# Patient Record
Sex: Female | Born: 1985 | Race: White | Hispanic: No | Marital: Married | Smoking: Former smoker
Health system: Southern US, Community
[De-identification: ages and names within clinical notes are randomized; demographics above are authoritative.]

## PROBLEM LIST (undated history)

## (undated) DIAGNOSIS — S82899A Other fracture of unspecified lower leg, initial encounter for closed fracture: Secondary | ICD-10-CM

## (undated) DIAGNOSIS — Z01419 Encounter for gynecological examination (general) (routine) without abnormal findings: Secondary | ICD-10-CM

## (undated) HISTORY — PX: OTHER SURGICAL HISTORY: SHX169

## (undated) HISTORY — DX: Other fracture of unspecified lower leg, initial encounter for closed fracture: S82.899A

## (undated) HISTORY — DX: Encounter for gynecological examination (general) (routine) without abnormal findings: Z01.419

---

## 2006-10-07 ENCOUNTER — Other Ambulatory Visit: Admission: RE | Admit: 2006-10-07 | Discharge: 2006-10-07 | Payer: Self-pay | Admitting: Obstetrics and Gynecology

## 2006-12-15 ENCOUNTER — Ambulatory Visit: Payer: Self-pay | Admitting: Family Medicine

## 2006-12-29 ENCOUNTER — Other Ambulatory Visit: Admission: RE | Admit: 2006-12-29 | Discharge: 2006-12-29 | Payer: Self-pay | Admitting: Obstetrics and Gynecology

## 2007-01-03 ENCOUNTER — Ambulatory Visit: Payer: Self-pay | Admitting: Internal Medicine

## 2007-01-31 ENCOUNTER — Ambulatory Visit: Payer: Self-pay | Admitting: Internal Medicine

## 2008-10-02 ENCOUNTER — Ambulatory Visit: Payer: Self-pay | Admitting: Internal Medicine

## 2008-10-02 ENCOUNTER — Telehealth: Payer: Self-pay | Admitting: Internal Medicine

## 2008-10-02 DIAGNOSIS — J45901 Unspecified asthma with (acute) exacerbation: Secondary | ICD-10-CM | POA: Insufficient documentation

## 2008-10-02 DIAGNOSIS — J309 Allergic rhinitis, unspecified: Secondary | ICD-10-CM | POA: Insufficient documentation

## 2008-10-15 ENCOUNTER — Telehealth: Payer: Self-pay | Admitting: Internal Medicine

## 2008-12-10 ENCOUNTER — Ambulatory Visit: Payer: Self-pay | Admitting: Internal Medicine

## 2008-12-10 DIAGNOSIS — J45909 Unspecified asthma, uncomplicated: Secondary | ICD-10-CM | POA: Insufficient documentation

## 2009-01-15 ENCOUNTER — Telehealth: Payer: Self-pay | Admitting: Internal Medicine

## 2009-02-10 ENCOUNTER — Ambulatory Visit: Payer: Self-pay | Admitting: Internal Medicine

## 2009-02-11 ENCOUNTER — Other Ambulatory Visit: Admission: RE | Admit: 2009-02-11 | Discharge: 2009-02-11 | Payer: Self-pay | Admitting: Obstetrics and Gynecology

## 2009-06-13 ENCOUNTER — Ambulatory Visit: Payer: Self-pay | Admitting: Internal Medicine

## 2009-07-31 ENCOUNTER — Telehealth: Payer: Self-pay | Admitting: Internal Medicine

## 2009-12-13 HISTORY — PX: OTHER SURGICAL HISTORY: SHX169

## 2010-01-07 ENCOUNTER — Ambulatory Visit (HOSPITAL_COMMUNITY): Admission: RE | Admit: 2010-01-07 | Discharge: 2010-01-08 | Payer: Self-pay | Admitting: Specialist

## 2010-04-06 ENCOUNTER — Ambulatory Visit (HOSPITAL_BASED_OUTPATIENT_CLINIC_OR_DEPARTMENT_OTHER): Admission: RE | Admit: 2010-04-06 | Discharge: 2010-04-06 | Payer: Self-pay | Admitting: Specialist

## 2010-08-03 ENCOUNTER — Ambulatory Visit: Payer: Self-pay | Admitting: Internal Medicine

## 2010-08-03 DIAGNOSIS — E669 Obesity, unspecified: Secondary | ICD-10-CM

## 2010-08-10 ENCOUNTER — Telehealth: Payer: Self-pay | Admitting: *Deleted

## 2010-11-19 ENCOUNTER — Emergency Department (HOSPITAL_COMMUNITY): Admission: EM | Admit: 2010-11-19 | Discharge: 2009-12-27 | Payer: Self-pay | Admitting: Emergency Medicine

## 2011-01-01 ENCOUNTER — Encounter: Payer: Self-pay | Admitting: Internal Medicine

## 2011-01-12 NOTE — Assessment & Plan Note (Signed)
Summary: med check and refill/cjr   Vital Signs:  Patient profile:   25 year old female Menstrual status:  irregular LMP:     07/30/2010 Height:      63.75 inches Weight:      233 pounds BMI:     40.45 O2 Sat:      98 % on Room air Pulse rate:   66 / minute BP sitting:   110 / 70  (right arm) Cuff size:   large  Vitals Entered By: Romualdo Bolk, CMA Duncan Dull) (August 03, 2010 3:34 PM)  O2 Flow:  Room air CC: Follow-up visit on meds LMP (date): 07/30/2010 LMP - Character: normal Menarche (age onset years): 11   Menses interval (days): 28 Menstrual flow (days): 7-14 Enter LMP: 07/30/2010   History of Present Illness: Sherry Quinn  comes in today  for follow up of resp status and meds.   Since last visit she had a broken leg. On Jan14 tripped and broke  leg fibula  and  medial ankle.     had surgery Per Dr Dr Shelle Iron.  was on PT but too expensive  and stopped .   pain is tolerable.   so far.   walks ok.    No falls.  RESP: NO major flares  ., noted better since moving and also when went to [place] and breathing was very good.  Triggers :muggy days  and weather related.   seasonal  Not bad now and no rescue inhaler currently No recent meds.   has symbicort and takes  almost  once daily if needed.   and needs refill.   NO cp sob otherwise . weight up and down know needs to lose weight.    Preventive Screening-Counseling & Management  Alcohol-Tobacco     Alcohol drinks/day: <1     Smoking Status: quit     Year Quit: 9/08  Caffeine-Diet-Exercise     Caffeine use/day: 1-2     Does Patient Exercise: no  Current Medications (verified): 1)  Symbicort 160-4.5 Mcg/act Aero (Budesonide-Formoterol Fumarate) .... 2 Puffs Two Times A Day 2)  Ventolin Hfa 108 (90 Base) Mcg/act Aers (Albuterol Sulfate) .... 2 Puffs Qid As Needed  For Wheezing  Allergies (verified): No Known Drug Allergies  Past History:  Past medical, surgical, family and social histories (including risk  factors) reviewed, and no changes noted (except as noted below).  Past Medical History: Allergic rhinitis  pollen  dust mites  ? if has asthma     EIA   dx in the past.   hx allergy shots as a teen fracture annkle leg  CON[facility]ULTANT[facility]  Gyne Romine     Past Surgical History: Left ganglion cyst fracture fibula  and ankle Jan 2011 Dr Jillyn Hidden  Past History:  Care Management: Gynecology: Nona Dell Shelle Iron  Family History: Reviewed history from 02/10/2009 and no changes required. Father:  and w   back problems  Mother:   low iron  Siblings:   Gallbladder removed     Social History: Reviewed history from 06/13/2009 and no changes required. Former Smoker stopped a year ago.  2008 HH of 3    father in law smokes     Married no ets  Has a cat for a year.      Review of Systems  The patient denies anorexia, fever, vision loss, decreased hearing, chest pain, syncope, dyspnea on exertion, peripheral edema, prolonged cough, abnormal bleeding, enlarged lymph nodes, and angioedema.  Physical Exam  General:  alert, well-developed, and well-nourished.   Head:  normocephalic and atraumatic.   Eyes:  vision grossly intact, pupils equal, and pupils round.   Ears:  R ear normal, L ear normal, and no external deformities.   Nose:  no external deformity, no external erythema, and no nasal discharge.   Mouth:  good dentition and pharynx pink and moist.   Neck:  No deformities, masses, or tenderness noted. Lungs:  Normal respiratory effort, chest expands symmetrically. Lungs are clear to auscultation, no crackles or wheezes.no dullness.   Heart:  Normal rate and regular rhythm. S1 and S2 normal without gallop, murmur, click, rub or other extra sounds.no lifts.   Abdomen:  Bowel sounds positive,abdomen soft and non-tender without masses, organomegaly or   noted. Msk:  healed scar left ankle Pulses:  nl perfusin Extremities:  no clubbing cyanosis or edema  Neurologic:  grossly non focal    Cervical Nodes:  No lymphadenopathy noted Psych:  Oriented X3, good eye contact, not anxious appearing, and not depressed appearing.     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 2 puffs two times a day    Ventolin Hfa 108 (90 Base) Mcg/act Aers (Albuterol sulfate) .Marland Kitchen... 2 puffs qid as needed  for wheezing  Pulmonary Functions Reviewed: O2 sat: 98 (08/03/2010)  Problem # 2:  ALLERGIC RHINITIS (ICD-477.9) call if problematic and can do nasal steroid if needed  Problem # 3:  OBESITY (ICD-278.00) counseled  about   weight health and lose weight.  can help her asthma.   Ht: 63.75 (08/03/2010)   Wt: 233 (08/03/2010)   BMI: 40.45 (08/03/2010)  Complete Medication List: 1)  Symbicort 160-4.5 Mcg/act Aero (Budesonide-formoterol fumarate) .... 2 puffs two times a day 2)  Ventolin Hfa 108 (90 Base) Mcg/act Aers (Albuterol sulfate) .... 2 puffs qid as needed  for wheezing  Patient Instructions: 1)  can try the   rescue inhaler before.  exercise  2)  losing weight will help your asthma.  3)  call us about your  mail order   [facility]  Prescriptions: VENTOLIN HFA 108 (90 BASE) MCG/ACT AERS (ALBUTEROL SULFATE) 2 puffs qid as needed  for wheezing  #1 x 2   Entered and Authorized by:   Madelin Headings MD   Signed by:   Madelin Headings MD on 08/03/2010   Method used:   Electronically to        [facility] # [phone]* (retail)       [address]       [place], [place]  [address]       Ph: [phone]       Fax: [phone]   RxID:   [id number]  greater than 50% of visit spent in counseling  25 minutes

## 2011-01-12 NOTE — Progress Notes (Signed)
Summary: Pt insurance has changed to [employer]. Send Electronic Scripts  Phone Note Call from Patient Call back at [phone] cell   Caller: Patient Summary of Call: Pts insurance has changed to [employer]. Pls call send electronic script to [phone]. Initial call taken by: Lucy Antigua,  August 10, 2010 10:08 AM  Follow-up for Phone Call        LM for pt to call back. Need more info on how escripts and what rx's need to be sent. Follow-up by: Romualdo Bolk, CMA Duncan Dull),  August 10, 2010 3:11 PM  Additional Follow-up for Phone Call Additional follow up Details #1::        Pt called back said that symbicort is the rx that she needs refills on. [facility] Rx sent. Additional Follow-up by: Romualdo Bolk, CMA (AAMA),  August 10, 2010 4:18 PM    Prescriptions: SYMBICORT 160-4.5 MCG/ACT AERO (BUDESONIDE-FORMOTEROL FUMARATE) 2 puffs two times a day  #3 x 2   Entered by:   Romualdo Bolk, CMA (AAMA)   Authorized by:   Madelin Headings MD   Signed by:   Romualdo Bolk, CMA (AAMA) on 08/10/2010   Method used:   Electronically to        [facility]* (mail-order)             , [place]         Ph: [phone]       Fax: [phone]   RxID:   [id number]   Appended Document: Pt insurance has changed to [employer]. Send [facility] Pt's electronic faxed didn't go thru so I sent it via fax rx. Romualdo Bolk, CMA (AAMA)  August 10, 2010 4:34 PM   Clinical Lists Changes  Medications: Rx of SYMBICORT 160-4.5 MCG/ACT AERO (BUDESONIDE-FORMOTEROL FUMARATE) 2 puffs two times a day;  #3 x 2;  Signed;  Entered by: Romualdo Bolk, CMA (AAMA);  Authorized by: Madelin Headings MD;  Method used: [facility]ed to [facility] , , [place]  , Ph: [phone], Fax: [phone]    Prescriptions: SYMBICORT 160-4.5 MCG/ACT AERO (BUDESONIDE-FORMOTEROL FUMARATE) 2 puffs two times a day  #3 x 2   Entered by:   Romualdo Bolk, CMA (AAMA)   Authorized by:    Madelin Headings MD   Signed by:   Romualdo Bolk, CMA (AAMA) on 08/10/2010   Method used:   Faxed to ...       [facility] (mail-order)             , [place]         Ph: [phone]       Fax: [phone]   RxID:   [id number]

## 2011-01-20 NOTE — Medication Information (Signed)
Summary: PA and Approval for Symbicort  PA and Approval for Symbicort   Imported By: Maryln Gottron 01/13/2011 11:30:19  _____________________________________________________________________  External Attachment:    Type:   Image     Comment:   External Document

## 2011-03-01 LAB — CBC
MCV: 81.4 fL (ref 78.0–100.0)
Platelets: 355 10*3/uL (ref 150–400)
RBC: 4.92 MIL/uL (ref 3.87–5.11)
WBC: 8.8 10*3/uL (ref 4.0–10.5)

## 2011-03-01 LAB — BASIC METABOLIC PANEL
Calcium: 9.5 mg/dL (ref 8.4–10.5)
Creatinine, Ser: 0.87 mg/dL (ref 0.4–1.2)
GFR calc non Af Amer: >60 mL/min (ref >60)
Sodium: 138 mEq/L (ref 135–145)

## 2011-03-01 LAB — PREGNANCY, URINE: Preg Test, Ur: NEGATIVE

## 2012-04-10 ENCOUNTER — Other Ambulatory Visit: Payer: Self-pay | Admitting: Internal Medicine

## 2012-04-10 NOTE — Telephone Encounter (Signed)
Pt needs new rx symbicort call into [facility] in [place] [phone].

## 2012-04-10 NOTE — Telephone Encounter (Signed)
Pt last seen for a med check on 08/03/10.  Symbicort 160-4.5 2 puffs twice daily.  Pls advise.

## 2012-04-10 NOTE — Telephone Encounter (Signed)
I have not seen her since 2011 needs OV .  Assess if other md is managing her asthma as it has been so long since we saw her.  If needed we can refill x 1 only until she sees me.

## 2012-04-11 MED ORDER — BUDESONIDE-FORMOTEROL FUMARATE 160-4.5 MCG/ACT IN AERO: 2.0000 | INHALATION_SPRAY | Freq: Two times a day (BID) | RESPIRATORY_TRACT | Status: DC

## 2012-04-11 NOTE — Telephone Encounter (Signed)
Attempted to call pt but number busy.  Will attempt to call at a later time.  

## 2012-04-11 NOTE — Telephone Encounter (Signed)
Pt is aware and states she will schedule a f/u. Pt states when she was using [facility] her 3 month supply lasted about 6 months.  Since the weather is chaning pt states she has been having some trouble.  [facility] [place].

## 2012-04-13 ENCOUNTER — Encounter: Payer: Self-pay | Admitting: Internal Medicine

## 2012-04-14 ENCOUNTER — Ambulatory Visit (INDEPENDENT_AMBULATORY_CARE_PROVIDER_SITE_OTHER): Payer: BC Managed Care – PPO | Admitting: Internal Medicine

## 2012-04-14 ENCOUNTER — Encounter: Payer: Self-pay | Admitting: Internal Medicine

## 2012-04-14 VITALS — BP 122/76 | HR 125 | Temp 98.9°F | Wt 234.0 lb

## 2012-04-14 DIAGNOSIS — J309 Allergic rhinitis, unspecified: Secondary | ICD-10-CM

## 2012-04-14 DIAGNOSIS — E669 Obesity, unspecified: Secondary | ICD-10-CM

## 2012-04-14 DIAGNOSIS — J45909 Unspecified asthma, uncomplicated: Secondary | ICD-10-CM

## 2012-04-14 MED ORDER — ALBUTEROL SULFATE HFA 108 (90 BASE) MCG/ACT IN AERS: 2.0000 | INHALATION_SPRAY | Freq: Four times a day (QID) | RESPIRATORY_TRACT | Status: DC | PRN

## 2012-04-14 MED ORDER — BUDESONIDE-FORMOTEROL FUMARATE 160-4.5 MCG/ACT IN AERO: 2.0000 | INHALATION_SPRAY | Freq: Two times a day (BID) | RESPIRATORY_TRACT | Status: DC

## 2012-04-14 NOTE — Patient Instructions (Addendum)
Get a establish with a  OB gyne .for reasons we discussed.  Weight loss will help the asthma.  Continue tracking.  3500 calories is the energy content of a pound of body weight .Must have a 3500 cal deficit to lose one pound . Thus decrease 500 calorie equivalent per day in food or drink intake / or exercise  for 7 days to lose one pound.  Ok to use the inhalers as you are but the Symbicort is a controller inhaler and works better if you use it every day in your risk times even if it's only 1-2 puffs. Certainly you can use it on day she would exercise it may prevent exercise-induced wheezing.  If you are having difficulties with the asthma you need a followup visit otherwise you can come back in a year.  Asthma Prevention Cigarette smoke, house dust, molds, pollens, animal dander, certain insects, exercise, and even cold air are all triggers that can cause an asthma attack. Often, no specific triggers are identified.  Take the following measures around your house to reduce attacks:  Avoid cigarette and other smoke. No smoking should be allowed in a home where someone with asthma lives. If smoking is allowed indoors, it should be done in a room with a closed door, and a window should be opened to clear the air. If possible, do not use a wood-burning stove, kerosene heater, or fireplace. Minimize exposure to all sources of smoke, including incense, candles, fires, and fireworks.   Decrease pollen exposure. Keep your windows shut and use central air during the pollen allergy season. Stay indoors with windows closed from late morning to afternoon, if you can. Avoid mowing the lawn if you have grass pollen allergy. Change your clothes and shower after being outside during this time of year.   Remove molds from bathrooms and wet areas. Do this by cleaning the floors with a fungicide or diluted bleach. Avoid using humidifiers, vaporizers, or swamp coolers. These can spread molds through the air. Fix  leaky faucets, pipes, or other sources of water that have mold around them.   Decrease house dust exposure. Do this by using bare floors, vacuuming frequently, and changing furnace and air cooler filters frequently. Avoid using feather, wool, or foam bedding. Use polyester pillows and plastic covers over your mattress. Wash bedding weekly in hot water (hotter than 130 F).   Try to get someone else to vacuum for you once or twice a week, if you can. Stay out of rooms while they are being vacuumed and for a short while afterward. If you vacuum, use a dust mask (from a hardware store), a double-layered or microfilter vacuum cleaner bag, or a vacuum cleaner with a HEPA filter.   Avoid perfumes, talcum powder, hair spray, paints and other strong odors and fumes.   Keep warm-blooded pets (cats, dogs, rodents, birds) outside the home if they are triggers for asthma. If you can't keep the pet outdoors, keep the pet out of your bedroom and other sleeping areas at all times, and keep the door closed. Remove carpets and furniture covered with cloth from your home. If that is not possible, keep the pet away from fabric-covered furniture and carpets.   Eliminate cockroaches. Keep food and garbage in closed containers. Never leave food out. Use poison baits, traps, powders, gels, or paste (for example, boric acid). If a spray is used to kill cockroaches, stay out of the room until the odor goes away.   Decrease indoor humidity  to less than 60%. Use an indoor air cleaning device.   Avoid sulfites in foods and beverages. Do not drink beer or wine or eat dried fruit, processed potatoes, or shrimp if they cause asthma symptoms.   Avoid cold air. Cover your nose and mouth with a scarf on cold or windy days.   Avoid aspirin. This is the most common drug causing serious asthma attacks.   If exercise triggers your asthma, ask your caregiver how you should prepare before exercising. (For example, ask if you could use  your inhaler 10 minutes before exercising.)   Avoid close contact with people who have a cold or the flu since your asthma symptoms may get worse if you catch the infection from them. Wash your hands thoroughly after touching items that may have been handled by others with a respiratory infection.   Get a flu shot every year to protect against the flu virus, which often makes asthma worse for days to weeks. Also get a pneumonia shot once every five to 10 years.  Call your caregiver if you want further information about measures you can take to help prevent asthma attacks. Document Released: 11/29/2005 Document Revised: 11/18/2011 Document Reviewed: 10/07/2009 [facility] Patient Information 2012 [place], [place].

## 2012-04-14 NOTE — Progress Notes (Signed)
  Subjective:    Patient ID: Sherry Quinn, female    DOB: 1986/11/01, 26 y.o.   MRN: [id number]  HPI Comes in today for asthma meds and management  Last ov was 8 2011 . Since then has had no hosp er visits or sever asthma attacks has now moved to [place].  Gets sob chest tightness  Spring and fall is an issue    Not year round use of med.  Cleaning  House.   Is also a trigger . Uses intermittent symbicort  Once puff every day or every other day. Seems to work   Has no utd rescue.Not using the rescue inhaler.  Has upper congestion and allergy sx but no fever  allergies benadryl at night.    Review of Systems No fever cp gi gu issues but irreg menses  Last pap a few years ago doesn't have a gyne yet.  Battling weight . tracking  And beginning exercise  Uses inhaler prn them   Husband works  [employer]   She is Arts development officer.     Objective:   Physical Exam BP 122/76  Pulse 125  Temp(Src) 98.9 F (37.2 C) (Oral)  Wt 234 lb (106.142 kg)  SpO2 98%  LMP 02/22/2012 Wt Readings from Last 3 Encounters:  04/14/12 234 lb (106.142 kg)  08/03/10 233 lb (105.688 kg)  06/13/09 226 lb (102.513 kg)    HEENT: Normocephalic ;atraumatic , Eyes;  PERRL, EOMs  Full, lids and conjunctiva clear,,Ears: no deformities, canals nl, TM landmarks normal, Nose: no deformity or discharge  Mouth : OP clear without lesion or edema .  Neck: Supple without adenopathy or masses or bruits Chest:  Clear to A&P without wheezes rales or rhonchi CV:  S1-S2 no gallops or murmurs peripheral perfusion is normal No clubbing cyanosis or edema  Skin: normal capillary refill ,turgor , color: No acute rashes ,petechiae or bruising     Assessment & Plan:  Asthma  Mild intermittent  Using a controller medicine intermittently in season however she seems to have it controlled and no acute asthma and attacks otherwise. Will call in the rescue inhaler and discussed daily use for suppression in season. She'll contact us if this is  not working we could do was considered down sizing to a steroid inhaler without on rescue but are present she is stable.  OBesity discussed strategies for weight loss  GYN have her setup with an OB/GYN because of irregular periods on preconception discussion.  Total visit > 50% spent counseling and coordinating care

## 2012-05-29 ENCOUNTER — Other Ambulatory Visit: Payer: Self-pay | Admitting: Family Medicine

## 2012-05-29 MED ORDER — BUDESONIDE-FORMOTEROL FUMARATE 160-4.5 MCG/ACT IN AERO: 2.0000 | INHALATION_SPRAY | Freq: Two times a day (BID) | RESPIRATORY_TRACT | Status: DC

## 2012-08-27 ENCOUNTER — Other Ambulatory Visit: Payer: Self-pay | Admitting: Internal Medicine

## 2013-06-28 ENCOUNTER — Ambulatory Visit (INDEPENDENT_AMBULATORY_CARE_PROVIDER_SITE_OTHER): Payer: BC Managed Care – PPO | Admitting: Internal Medicine

## 2013-06-28 ENCOUNTER — Ambulatory Visit (INDEPENDENT_AMBULATORY_CARE_PROVIDER_SITE_OTHER)
Admission: RE | Admit: 2013-06-28 | Discharge: 2013-06-28 | Disposition: A | Discharge status: Home / Self Care | Payer: BC Managed Care – PPO | Source: Ambulatory Visit | Attending: Internal Medicine | Admitting: Internal Medicine

## 2013-06-28 ENCOUNTER — Encounter: Payer: Self-pay | Admitting: Internal Medicine

## 2013-06-28 ENCOUNTER — Other Ambulatory Visit: Payer: Self-pay | Admitting: Internal Medicine

## 2013-06-28 VITALS — BP 142/90 | HR 83 | Temp 98.1°F | Wt 234.0 lb

## 2013-06-28 DIAGNOSIS — M546 Pain in thoracic spine: Secondary | ICD-10-CM

## 2013-06-28 DIAGNOSIS — J45909 Unspecified asthma, uncomplicated: Secondary | ICD-10-CM

## 2013-06-28 MED ORDER — BUDESONIDE-FORMOTEROL FUMARATE 160-4.5 MCG/ACT IN AERO: 2.0000 | INHALATION_SPRAY | Freq: Two times a day (BID) | RESPIRATORY_TRACT | Status: DC

## 2013-06-28 MED ORDER — PREDNISONE 10 MG PO TABS: ORAL_TABLET | ORAL | Status: DC

## 2013-06-28 MED ORDER — NAPROXEN 500 MG PO TABS: 500.0000 mg | ORAL_TABLET | Freq: Two times a day (BID) | ORAL | Status: DC

## 2013-06-28 NOTE — Patient Instructions (Addendum)
This acts like nerve irritation   Uncertain why.  This occurred  Antiinflammatories for now .     And x ray  Let you know result. If needed we may even use prednisone .   .  ( can get filled)    Continue stretch and yoga .   Fu if not  Getting better by next week or worse.

## 2013-06-28 NOTE — Progress Notes (Signed)
Chief Complaint  Patient presents with  . Back Pain    Between her should blades.  Goes and comes.    HPI: Patient comes in today for SDA for  new problem evaluation.  In usual state of health and then had  0nset of mid thoracic back pain sharp burning   Last week  Primarily at night  Onset  7 30 and not stopping.  Tried yoga  And icy hot.   Distracts.   Tolerates.   Hx of same off and on for 3 years   Goes away.fiatly quickly    No assoc sx  ? If stress  Last week.  No cough wheeze pleuritic  Physical activity  .   tightening around rib cage to from  NO HB NVD  No rashes or injury     Yoga helps.  But only temporarily.  Self rx  Left over 800 ibu profen   A couple.   Asthma stable ran out of symbicort  Was taking once a day for control in season no recent use of rescue inhaler .  ROS: See pertinent positives and negatives per HPI. No cough fever   lmp about a month ago  Doesn't think she is pregnant   Past Medical History  Diagnosis Date  . Allergic rhinitis     pollen and dust mites  . Gynecological examination     Dr. Tresa Res  . Fx ankle     Family History  Problem Relation Age of Onset  . Anemia Mother   . Gallbladder disease Other     History   Social History  . Marital Status: Married    Spouse Name: N/A    Number of Children: N/A  . Years of Education: N/A   Social History Main Topics  . Smoking status: Former Games developer  . Smokeless tobacco: None  . Alcohol Use: Yes     Comment: occ.   . Drug Use: None  . Sexually Active: None   Other Topics Concern  . None   Social History Narrative   Former smoker stopped a year ago; 2008   hh of 2     Married no ets.    Poodle and lab mix no   cats .     Outpatient Encounter Prescriptions as of 06/28/2013  Medication Sig Dispense Refill  . albuterol (VENTOLIN HFA) 108 (90 BASE) MCG/ACT inhaler Inhale 2 puffs into the lungs every 6 (six) hours as needed for wheezing (rescue inhaler).  1 Inhaler  2  .  budesonide-formoterol (SYMBICORT) 160-4.5 MCG/ACT inhaler Inhale 2 puffs into the lungs 2 (two) times daily. Or as directed  1 Inhaler  2  . naproxen (NAPROSYN) 500 MG tablet Take 1 tablet (500 mg total) by mouth 2 (two) times daily with a meal. For 7- 10 days for pain  30 tablet  1  . predniSONE (DELTASONE) 10 MG tablet Take pills per day,6,6,6,4,4,4,2,2,2,1,1,1  40 tablet  0  . [DISCONTINUED] budesonide-formoterol (SYMBICORT) 160-4.5 MCG/ACT inhaler Inhale 2 puffs into the lungs 2 (two) times daily. Or as directed  3 Inhaler  1   No facility-administered encounter medications on file as of 06/28/2013.    EXAM:  BP 142/90  Pulse 83  Temp(Src) 98.1 F (36.7 C) (Oral)  Wt 234 lb (106.142 kg)  BMI 40.49 kg/m2  SpO2 99%  Body mass index is 40.49 kg/(m^2).  GENERAL: vitals reviewed and listed above, alert, oriented, appears well hydrated and in no acute distress uncomfortable  HEENT: atraumatic, conjunctiva  clear, no obvious abnormalities on inspection of external nose and ears OP : no lesion edema or exudate  NECK: no obvious masses on inspection palpation  Spine  tendern mid lower thoracic spine  Area  No rash mild spasm   A bit uncomfortable  LUNGS: clear to auscultation bilaterally, no wheezes, rales or rhonchi, good air movement CV: HRRR, no clubbing cyanosis or  peripheral edema nl cap refill  MS: moves all extremities without noticeable focal  abnormality Neuro grossly normal.  PSYCH: pleasant and cooperative, no obvious depression or anxiety  ASSESSMENT AND PLAN:  Discussed the following assessment and plan:  Acute thoracic back pain - Plan: DG Thoracic Spine 4V, DG Chest 2 View  ASTHMA, EXTRINSIC - stable   out of inhaler but doing ok refill x 3 fu in 3 months or so  nsaid  Can change to pred if  persistent or progressive and x ray is ok .  Fu if still present.  -Patient advised to return or notify health care team  if symptoms worsen or persist or new concerns  arise.  Patient Instructions  This acts like nerve irritation   Uncertain why.  This occurred  Antiinflammatories for now .     And x ray  Let you know result. If needed we may even use prednisone .   .  ( can get filled)    Continue stretch and yoga .   Fu if not  Getting better by next week or worse.    Neta Mends. Jazon Jipson M.D.

## 2013-09-27 ENCOUNTER — Encounter: Payer: Self-pay | Admitting: Internal Medicine

## 2013-09-27 ENCOUNTER — Ambulatory Visit (INDEPENDENT_AMBULATORY_CARE_PROVIDER_SITE_OTHER): Payer: BC Managed Care – PPO | Admitting: Internal Medicine

## 2013-09-27 VITALS — BP 114/70 | HR 95 | Temp 98.1°F | Wt 236.0 lb

## 2013-09-27 DIAGNOSIS — E282 Polycystic ovarian syndrome: Secondary | ICD-10-CM

## 2013-09-27 DIAGNOSIS — N979 Female infertility, unspecified: Secondary | ICD-10-CM | POA: Insufficient documentation

## 2013-09-27 DIAGNOSIS — J45909 Unspecified asthma, uncomplicated: Secondary | ICD-10-CM

## 2013-09-27 DIAGNOSIS — Z7189 Other specified counseling: Secondary | ICD-10-CM

## 2013-09-27 DIAGNOSIS — Z23 Encounter for immunization: Secondary | ICD-10-CM

## 2013-09-27 MED ORDER — BUDESONIDE-FORMOTEROL FUMARATE 160-4.5 MCG/ACT IN AERO: 2.0000 | INHALATION_SPRAY | Freq: Two times a day (BID) | RESPIRATORY_TRACT | Status: DC

## 2013-09-27 NOTE — Patient Instructions (Addendum)
In the season  go back on symbicort  2 puffs twice a day    For now until  Better and then can decrease   Dose as tolerated.  Pregnancy category c     Flu vaccine as we discussed .benefit more than risk .  Get your gyne  To get a copy of   Visits and labs .  rov in 6-12 moinths or as needed

## 2013-09-27 NOTE — Progress Notes (Signed)
Chief Complaint  Patient presents with  . Follow-up  . Asthma    HPI: FU  Asthma management  Fall seems worse  Using more often  Ran out of symbicort. And noted  Since ran out 2-3 x per day.  rescu never. Out side   "Allergic to fall".   No ets.   Had no need for rescue this summer  Now increase no sig nocturnal issues or head sx .  Under fertility eval  Has PCOS  To begin clomid   Decline flu vaccine  No se hx   initally   ROS: See pertinent positives and negatives per HPI. No curren cp sob .   Past Medical History  Diagnosis Date  . Allergic rhinitis     pollen and dust mites  . Gynecological examination     Dr. Tresa Res  . Fx ankle     Family History  Problem Relation Age of Onset  . Anemia Mother   . Gallbladder disease Other     History   Social History  . Marital Status: Married    Spouse Name: N/A    Number of Children: N/A  . Years of Education: N/A   Social History Main Topics  . Smoking status: Former Games developer  . Smokeless tobacco: None  . Alcohol Use: Yes     Comment: occ.   . Drug Use: None  . Sexual Activity: None   Other Topics Concern  . None   Social History Narrative   Former smoker stopped a year ago; 2008   hh of 2     Married no ets.    Poodle and lab mix no   cats .     Outpatient Encounter Prescriptions as of 09/27/2013  Medication Sig Dispense Refill  . albuterol (VENTOLIN HFA) 108 (90 BASE) MCG/ACT inhaler Inhale 2 puffs into the lungs every 6 (six) hours as needed for wheezing (rescue inhaler).  1 Inhaler  2  . budesonide-formoterol (SYMBICORT) 160-4.5 MCG/ACT inhaler Inhale 2 puffs into the lungs 2 (two) times daily. Or as directed  3 Inhaler  3  . clomiPHENE (CLOMID) 50 MG tablet       . [DISCONTINUED] budesonide-formoterol (SYMBICORT) 160-4.5 MCG/ACT inhaler Inhale 2 puffs into the lungs 2 (two) times daily. Or as directed  1 Inhaler  2  . [DISCONTINUED] naproxen (NAPROSYN) 500 MG tablet Take 1 tablet (500 mg total) by mouth 2  (two) times daily with a meal. For 7- 10 days for pain  30 tablet  1  . [DISCONTINUED] predniSONE (DELTASONE) 10 MG tablet Take pills per day,6,6,6,4,4,4,2,2,2,1,1,1  40 tablet  0   No facility-administered encounter medications on file as of 09/27/2013.    EXAM:  BP 114/70  Pulse 95  Temp(Src) 98.1 F (36.7 C) (Oral)  Wt 236 lb (107.049 kg)  BMI 40.84 kg/m2  SpO2 98%  Body mass index is 40.84 kg/(m^2).  GENERAL: vitals reviewed and listed above, alert, oriented, appears well hydrated and in no acute distress HEENT: atraumatic, conjunctiva  clear, no obvious abnormalities on inspection of external nose and ears OP : no lesion edema or exudate  NECK: no obvious masses on inspection palpation  LUNGS: clear to auscultation bilaterally, no wheezes, rales or rhonchi, good air movement CV: HRRR, no clubbing cyanosis or  peripheral edema nl cap refill  MS: moves all extremities without noticeable focal  abnormality PSYCH: pleasant and cooperative, no obvious depression or anxiety  ASSESSMENT AND PLAN:  Discussed the following assessment and  plan:  Extrinsic asthma, unspecified - out of controller med restart no step down until stable   Need for prophylactic vaccination and inoculation against influenza - Plan: Flu Vaccine QUAD 36+ mos PF IM (Fluarix)  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  Patient Instructions  In the season  go back on symbicort  2 puffs twice a day    For now until  Better and then can decrease   Dose as tolerated.  Pregnancy category c     Flu vaccine as we discussed .benefit more than risk  .  Get your gyne  To get a copy of   Visits and labs .  rov in 6-12 moinths or as needed   Neta Mends. Porter Moes M.D.

## 2014-05-27 ENCOUNTER — Telehealth: Payer: Self-pay | Admitting: Internal Medicine

## 2014-05-27 NOTE — Telephone Encounter (Signed)
Pt called her insurance no longer will pay for budesonide-formoterol (SYMBICORT) 160-4.5 MCG/ACT inhaler she will need a new rx for whatever Dr Fabian SharpPanosh thinks will work for her

## 2014-05-27 NOTE — Telephone Encounter (Signed)
Patient will need to call her insurance to find out what is covered.

## 2014-05-28 NOTE — Telephone Encounter (Signed)
S/w pt she will contact her insurance company

## 2014-05-29 ENCOUNTER — Telehealth: Payer: Self-pay | Admitting: Internal Medicine

## 2014-05-29 MED ORDER — MOMETASONE FURO-FORMOTEROL FUM 200-5 MCG/ACT IN AERO: 2.0000 | INHALATION_SPRAY | Freq: Two times a day (BID) | RESPIRATORY_TRACT | Status: DC

## 2014-05-29 NOTE — Telephone Encounter (Signed)
Pt said she checked with her insurance company and they said [place] or advair is covered and she said which ever one Dr Fabian SharpPanosh think will work  They no longer pay for  budesonide-formoterol (SYMBICORT) 160-4.5 MCG/ACT inhaler   Pharmacy; [facility] on [address] in [place]

## 2014-05-29 NOTE — Telephone Encounter (Signed)
Can you prescribe a new prescription?  Thanks!

## 2014-05-29 NOTE — Telephone Encounter (Signed)
[facility] to take 2 puffs bid. Call in #1 with no rf

## 2014-05-29 NOTE — Telephone Encounter (Signed)
See other note

## 2014-05-29 NOTE — Telephone Encounter (Signed)
Sent to the pharmacy by e-scribe. 

## 2014-05-30 NOTE — Telephone Encounter (Signed)
Patient notified to pick up at the pharmacy. 

## 2014-08-22 ENCOUNTER — Other Ambulatory Visit: Payer: Self-pay | Admitting: Family Medicine

## 2014-08-22 ENCOUNTER — Telehealth: Payer: Self-pay | Admitting: Family Medicine

## 2014-08-22 DIAGNOSIS — Z0000 Encounter for general adult medical examination without abnormal findings: Secondary | ICD-10-CM

## 2014-08-22 NOTE — Telephone Encounter (Signed)
Ok to refill  For 2 inhalers   Schedule yearly office visit before runs out

## 2014-08-22 NOTE — Telephone Encounter (Signed)
Per [facility], pt is due for his yearly wellness exam.  Please call the pt to schedule fasting lab work and CPE.  I have placed the orders. Thanks!

## 2014-08-22 NOTE — Telephone Encounter (Signed)
Sent to the pharmacy by e-scribe.  Will send a message to the front desk to have the pt scheduled.

## 2014-08-27 NOTE — Telephone Encounter (Signed)
Pt called back stating she goes to her GYN for physicals and did not want to schedule a CPE.  She did state that she sees her PCP for asthma medication and scheduled a med check appointment for that, 09/11/14.

## 2014-08-27 NOTE — Telephone Encounter (Signed)
lmom for pt to cb

## 2014-08-27 NOTE — Telephone Encounter (Signed)
FYI

## 2014-08-27 NOTE — Telephone Encounter (Signed)
This is fine 

## 2014-09-11 ENCOUNTER — Ambulatory Visit: Payer: BC Managed Care – PPO | Admitting: Internal Medicine

## 2014-10-14 ENCOUNTER — Other Ambulatory Visit: Payer: Self-pay | Admitting: Internal Medicine

## 2014-10-16 NOTE — Telephone Encounter (Signed)
Needs ov   Canceled last appt and didn't reschedule

## 2014-11-05 ENCOUNTER — Ambulatory Visit: Payer: BC Managed Care – PPO | Admitting: Internal Medicine

## 2014-11-14 ENCOUNTER — Encounter: Payer: BC Managed Care – PPO | Admitting: Internal Medicine

## 2014-11-14 NOTE — Progress Notes (Signed)
Document opened and reviewed for OV but appt  canceled same day .  

## 2014-11-27 ENCOUNTER — Other Ambulatory Visit: Payer: Self-pay | Admitting: Internal Medicine

## 2014-11-27 NOTE — Telephone Encounter (Signed)
Sent to the pharmacy by e-scribe. 

## 2014-12-03 ENCOUNTER — Other Ambulatory Visit: Payer: Self-pay | Admitting: Family Medicine

## 2014-12-03 MED ORDER — MOMETASONE FURO-FORMOTEROL FUM 200-5 MCG/ACT IN AERO: INHALATION_SPRAY | RESPIRATORY_TRACT | Status: DC

## 2014-12-12 ENCOUNTER — Ambulatory Visit (INDEPENDENT_AMBULATORY_CARE_PROVIDER_SITE_OTHER): Payer: BC Managed Care – PPO | Admitting: Internal Medicine

## 2014-12-12 ENCOUNTER — Encounter: Payer: Self-pay | Admitting: Internal Medicine

## 2014-12-12 VITALS — BP 130/70 | Temp 98.3°F | Ht 63.5 in | Wt 220.3 lb

## 2014-12-12 DIAGNOSIS — Z2821 Immunization not carried out because of patient refusal: Secondary | ICD-10-CM

## 2014-12-12 DIAGNOSIS — J309 Allergic rhinitis, unspecified: Secondary | ICD-10-CM

## 2014-12-12 DIAGNOSIS — J45909 Unspecified asthma, uncomplicated: Secondary | ICD-10-CM

## 2014-12-12 MED ORDER — MOMETASONE FURO-FORMOTEROL FUM 200-5 MCG/ACT IN AERO: INHALATION_SPRAY | RESPIRATORY_TRACT | Status: AC

## 2014-12-12 MED ORDER — ALBUTEROL SULFATE HFA 108 (90 BASE) MCG/ACT IN AERS: 2.0000 | INHALATION_SPRAY | Freq: Four times a day (QID) | RESPIRATORY_TRACT | Status: AC | PRN

## 2014-12-12 NOTE — Progress Notes (Signed)
Pre visit review using our clinic review tool, if applicable. No additional management support is needed unless otherwise documented below in the visit note.  Chief Complaint  Patient presents with  . Follow-up    asthma    HPI: Sherry Quinn 28 y.o. comesin for yearly check for asthma management last seen 10 14 .Marland Kitchen. Doing ok  Not using rescue inhaler  But today  Sinus drainage   And headache for now  / making her cough today . No tobacco .  dulera year round . 2 puffs  Bid fall  Other  q d  Just using saline spray  To help.  No nasal steroids at this time.  Allergy shots   At 14 . Helped for a while  Under fertility rx  and med for pcos  ROS: See pertinent positives and negatives per HPI. No cp sob  Fever   Past Medical History  Diagnosis Date  . Allergic rhinitis     pollen and dust mites  . Gynecological examination     Dr. Tresa Resomine  . Fx ankle     Family History  Problem Relation Age of Onset  . Anemia Mother   . Gallbladder disease Other     History   Social History  . Marital Status: Married    Spouse Name: N/A    Number of Children: N/A  . Years of Education: N/A   Social History Main Topics  . Smoking status: Former Games developermoker  . Smokeless tobacco: Not on file  . Alcohol Use: Yes     Comment: occ.   . Drug Use: Not on file  . Sexual Activity: Not on file   Other Topics Concern  . Not on file   Social History Narrative   Former smoker stopped a year ago; 2008   hh of 2     Married no ets.    Poodle and lab mix no   cats .     Outpatient Encounter Prescriptions as of 12/12/2014  Medication Sig  . albuterol (VENTOLIN HFA) 108 (90 BASE) MCG/ACT inhaler Inhale 2 puffs into the lungs every 6 (six) hours as needed for wheezing (rescue inhaler).  Marland Kitchen. levothyroxine (SYNTHROID, LEVOTHROID) 50 MCG tablet Take 50 mcg by mouth daily.   . metFORMIN (GLUCOPHAGE) 1000 MG tablet Take 1,000 mg by mouth daily.   . mometasone-formoterol (DULERA) 200-5 MCG/ACT AERO  INHALE 2 PUFFS INTO THE LUNGS 2 TIMES DAILY  . [DISCONTINUED] albuterol (VENTOLIN HFA) 108 (90 BASE) MCG/ACT inhaler Inhale 2 puffs into the lungs every 6 (six) hours as needed for wheezing (rescue inhaler).  . [DISCONTINUED] mometasone-formoterol (DULERA) 200-5 MCG/ACT AERO INHALE 2 PUFFS INTO THE LUNGS 2 TIMES DAILY  . letrozole (FEMARA) 2.5 MG tablet Take 7.5 mg by mouth daily.   . [DISCONTINUED] clomiPHENE (CLOMID) 50 MG tablet     EXAM:  BP 130/70 mmHg  Temp(Src) 98.3 F (36.8 C) (Oral)  Ht 5' 3.5" (1.613 m)  Wt 220 lb 4.8 oz (99.927 kg)  BMI 38.41 kg/m2  Body mass index is 38.41 kg/(m^2).  GENERAL: vitals reviewed and listed above, alert, oriented, appears well hydrated and in no acute distress ur congestion  Nasal no rep distress  HEENT: atraumatic, conjunctiva  clear, no obvious abnormalities on inspection of external nose and ears naose 3+ congested min fac tenderens OP : no lesion edema or exudate  NECK: no obvious masses on inspection palpation  LUNGS: clear to auscultation bilaterally, no wheezes, rales or rhonchi, good  air movement CV: HRRR, no clubbing cyanosis or  peripheral edema nl cap refill  MS: moves all extremities without noticeable focal  abnormality PSYCH: pleasant and cooperative, no obvious depression or anxiety  ASSESSMENT AND PLAN:  Discussed the following assessment and plan:  Asthma, unspecified asthma severity, uncomplicated - controlled by hx and exam  Allergic rhinitis, unspecified allergic rhinitis type - worse in fall  not controlled  take  Inhaled nasal cortisone.   Influenza vaccination declined - counseled benefoit more than risk hope she changes her ming. Under fertility rx  . Disc control       No rescue meds needed  at this time  But better nasal control .  Add nasal steroids. Every day     Expectant management.  -Patient advised to return or notify health care team  if symptoms worsen ,persist or new concerns arise.  Patient Instructions   Advise take inhaled nasal cortisone every day in season to prevent nasal allergy sx . Flonase... nasacort  Continue the same  dulera for asthma control .  Consider seeing  Allergist  Again  If not controlling.  Allergies . Antihistamine decongestant ok .  Reconsider.  flu vaccine  Because  Of risk of flu with asthma. More benefit than risk.  Yearly check but  Earlier if needed.    Neta MendsWanda K. Kimia Finan M.D.

## 2014-12-12 NOTE — Patient Instructions (Addendum)
Advise take inhaled nasal cortisone every day in season to prevent nasal allergy sx . Flonase... nasacort  Continue the same  dulera for asthma control .  Consider seeing  Allergist  Again  If not controlling.  Allergies . Antihistamine decongestant ok .  Reconsider.  flu vaccine  Because  Of risk of flu with asthma. More benefit than risk.  Yearly check but  Earlier if needed.

## 2015-05-22 IMAGING — CR DG CHEST 2V
2 series · 2 of 2 positions shown · non-contrast
Comparison: None.

Views: PA and lateral views.

CLINICAL DATA: Mid chest pain radiating to the anterior chest

CHEST - 2 VIEW

[view not recorded (1 of 2)]
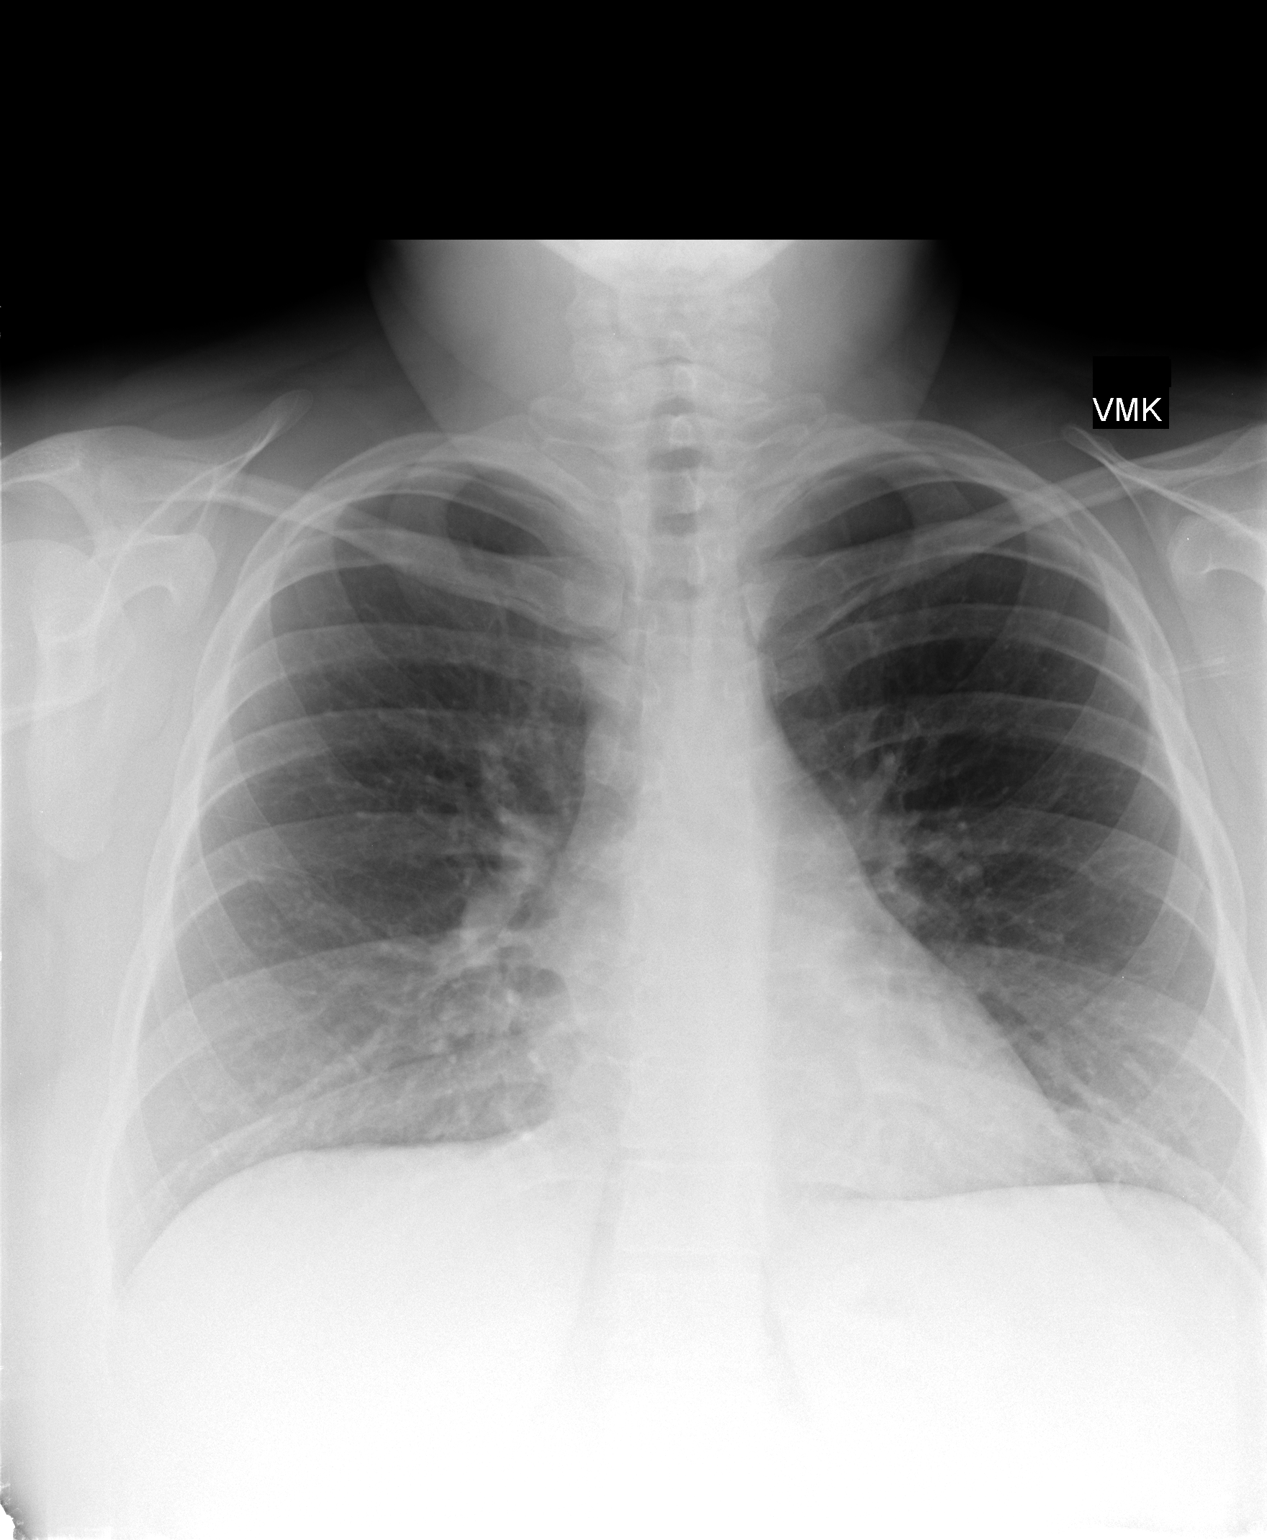

[view not recorded (2 of 2)]
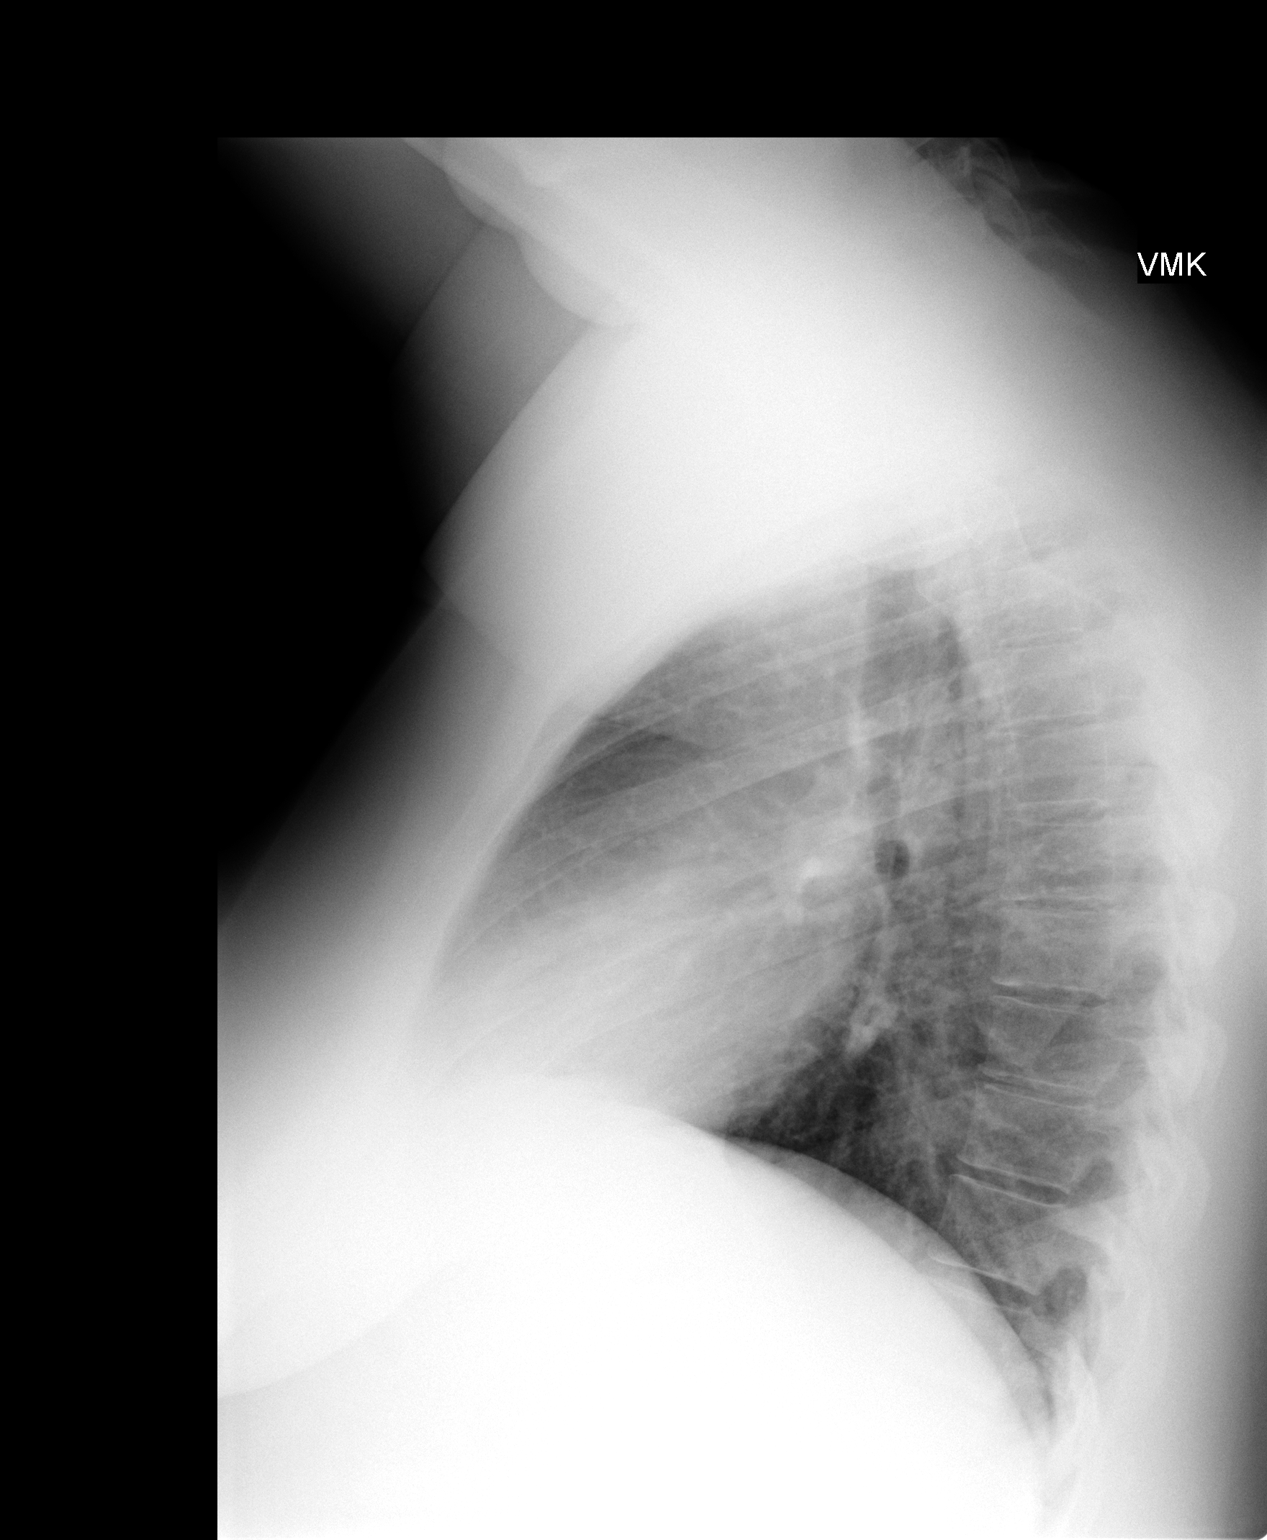

[2 of 2 positions shown; findings below may reference images not displayed]

FINDINGS: There is no focal infiltrate, pulmonary edema, or pleural
effusin. The mediastinal contour and cardiac silhouette are normal.
The soft tissue and osseous structures are normal.
IMPRESSION: No acute cardiopulmonary disease identified.
